# Patient Record
Sex: Male | Born: 1960
Health system: Southern US, Community
[De-identification: ages and names within clinical notes are randomized; demographics above are authoritative.]

---

## 2018-06-12 ENCOUNTER — Inpatient Hospital Stay
Admission: RE | Admit: 2018-06-12 | Discharge: 2018-06-22 | Disposition: A | Discharge status: Other Acute Inpatient Hospital | Payer: Self-pay | Source: Other Acute Inpatient Hospital | Attending: Internal Medicine | Admitting: Internal Medicine

## 2018-06-12 DIAGNOSIS — Z452 Encounter for adjustment and management of vascular access device: Secondary | ICD-10-CM

## 2018-06-12 DIAGNOSIS — R509 Fever, unspecified: Secondary | ICD-10-CM

## 2018-06-12 DIAGNOSIS — J90 Pleural effusion, not elsewhere classified: Secondary | ICD-10-CM

## 2018-06-12 DIAGNOSIS — K289 Gastrojejunal ulcer, unspecified as acute or chronic, without hemorrhage or perforation: Secondary | ICD-10-CM

## 2018-06-13 LAB — BASIC METABOLIC PANEL
Anion gap: 8 (ref 5–15)
BUN: 27 mg/dL — ABNORMAL HIGH (ref 6–20)
CHLORIDE: 119 mmol/L — AB (ref 98–111)
CO2: 27 mmol/L (ref 22–32)
Calcium: 7.5 mg/dL — ABNORMAL LOW (ref 8.9–10.3)
Creatinine, Ser: 1.06 mg/dL (ref 0.61–1.24)
GFR calc Af Amer: >60 mL/min (ref >60)
GFR calc non Af Amer: >60 mL/min (ref >60)
Glucose, Bld: 126 mg/dL — ABNORMAL HIGH (ref 70–99)
Potassium: 3.3 mmol/L — ABNORMAL LOW (ref 3.5–5.1)
SODIUM: 154 mmol/L — AB (ref 135–145)

## 2018-06-13 LAB — CBC
HCT: 25.3 % — ABNORMAL LOW (ref 39.0–52.0)
HEMOGLOBIN: 7.8 g/dL — AB (ref 13.0–17.0)
MCH: 28.8 pg (ref 26.0–34.0)
MCHC: 30.8 g/dL (ref 30.0–36.0)
MCV: 93.4 fL (ref 80.0–100.0)
NRBC: 0.1 % (ref 0.0–0.2)
Platelets: 399 10*3/uL (ref 150–400)
RBC: 2.71 MIL/uL — ABNORMAL LOW (ref 4.22–5.81)
RDW: 16.2 % — ABNORMAL HIGH (ref 11.5–15.5)
WBC: 31.1 10*3/uL — ABNORMAL HIGH (ref 4.0–10.5)

## 2018-06-14 LAB — CBC
HCT: 26.8 % — ABNORMAL LOW (ref 39.0–52.0)
HEMOGLOBIN: 7.7 g/dL — AB (ref 13.0–17.0)
MCH: 27.5 pg (ref 26.0–34.0)
MCHC: 28.7 g/dL — ABNORMAL LOW (ref 30.0–36.0)
MCV: 95.7 fL (ref 80.0–100.0)
Platelets: 433 10*3/uL — ABNORMAL HIGH (ref 150–400)
RBC: 2.8 MIL/uL — ABNORMAL LOW (ref 4.22–5.81)
RDW: 16.8 % — ABNORMAL HIGH (ref 11.5–15.5)
WBC: 25.2 10*3/uL — ABNORMAL HIGH (ref 4.0–10.5)
nRBC: 0 % (ref 0.0–0.2)

## 2018-06-14 LAB — COMPREHENSIVE METABOLIC PANEL
ALT: 25 U/L (ref 0–44)
AST: 19 U/L (ref 15–41)
Albumin: 1.6 g/dL — ABNORMAL LOW (ref 3.5–5.0)
Alkaline Phosphatase: 97 U/L (ref 38–126)
Anion gap: 8 (ref 5–15)
BUN: 27 mg/dL — ABNORMAL HIGH (ref 6–20)
CALCIUM: 7.8 mg/dL — AB (ref 8.9–10.3)
CO2: 28 mmol/L (ref 22–32)
Chloride: 122 mmol/L — ABNORMAL HIGH (ref 98–111)
Creatinine, Ser: 0.93 mg/dL (ref 0.61–1.24)
GFR calc Af Amer: >60 mL/min (ref >60)
GFR calc non Af Amer: >60 mL/min (ref >60)
Glucose, Bld: 116 mg/dL — ABNORMAL HIGH (ref 70–99)
Potassium: 2.9 mmol/L — ABNORMAL LOW (ref 3.5–5.1)
Sodium: 158 mmol/L — ABNORMAL HIGH (ref 135–145)
Total Bilirubin: 0.4 mg/dL (ref 0.3–1.2)
Total Protein: 5.4 g/dL — ABNORMAL LOW (ref 6.5–8.1)

## 2018-06-14 LAB — MAGNESIUM: Magnesium: 1.8 mg/dL (ref 1.7–2.4)

## 2018-06-14 LAB — TRIGLYCERIDES: Triglycerides: 52 mg/dL (ref <150)

## 2018-06-14 LAB — PHOSPHORUS: Phosphorus: 4.1 mg/dL (ref 2.5–4.6)

## 2018-06-15 LAB — CBC
HCT: 25.6 % — ABNORMAL LOW (ref 39.0–52.0)
Hemoglobin: 7.8 g/dL — ABNORMAL LOW (ref 13.0–17.0)
MCH: 29 pg (ref 26.0–34.0)
MCHC: 30.5 g/dL (ref 30.0–36.0)
MCV: 95.2 fL (ref 80.0–100.0)
Platelets: 401 10*3/uL — ABNORMAL HIGH (ref 150–400)
RBC: 2.69 MIL/uL — ABNORMAL LOW (ref 4.22–5.81)
RDW: 16.2 % — ABNORMAL HIGH (ref 11.5–15.5)
WBC: 24.3 10*3/uL — ABNORMAL HIGH (ref 4.0–10.5)
nRBC: 0 % (ref 0.0–0.2)

## 2018-06-15 LAB — BASIC METABOLIC PANEL
Anion gap: 9 (ref 5–15)
BUN: 20 mg/dL (ref 6–20)
CALCIUM: 7.5 mg/dL — AB (ref 8.9–10.3)
CO2: 24 mmol/L (ref 22–32)
Chloride: 115 mmol/L — ABNORMAL HIGH (ref 98–111)
Creatinine, Ser: 0.72 mg/dL (ref 0.61–1.24)
GFR calc Af Amer: >60 mL/min (ref >60)
GFR calc non Af Amer: >60 mL/min (ref >60)
Glucose, Bld: 117 mg/dL — ABNORMAL HIGH (ref 70–99)
Potassium: 3.3 mmol/L — ABNORMAL LOW (ref 3.5–5.1)
SODIUM: 148 mmol/L — AB (ref 135–145)

## 2018-06-15 LAB — MAGNESIUM: Magnesium: 1.4 mg/dL — ABNORMAL LOW (ref 1.7–2.4)

## 2018-06-16 LAB — BASIC METABOLIC PANEL
ANION GAP: 9 (ref 5–15)
BUN: 21 mg/dL — ABNORMAL HIGH (ref 6–20)
CO2: 22 mmol/L (ref 22–32)
Calcium: 7.1 mg/dL — ABNORMAL LOW (ref 8.9–10.3)
Chloride: 109 mmol/L (ref 98–111)
Creatinine, Ser: 0.79 mg/dL (ref 0.61–1.24)
GFR calc non Af Amer: >60 mL/min (ref >60)
Glucose, Bld: 123 mg/dL — ABNORMAL HIGH (ref 70–99)
Potassium: 2.8 mmol/L — ABNORMAL LOW (ref 3.5–5.1)
Sodium: 140 mmol/L (ref 135–145)

## 2018-06-16 LAB — MAGNESIUM: MAGNESIUM: 1.7 mg/dL (ref 1.7–2.4)

## 2018-06-16 LAB — PHOSPHORUS: PHOSPHORUS: 3.4 mg/dL (ref 2.5–4.6)

## 2018-06-17 LAB — URINALYSIS, ROUTINE W REFLEX MICROSCOPIC
Bilirubin Urine: NEGATIVE
Glucose, UA: 150 mg/dL — AB
Ketones, ur: NEGATIVE mg/dL
Nitrite: NEGATIVE
Protein, ur: NEGATIVE mg/dL
RBC / HPF: >50 RBC/hpf — ABNORMAL HIGH (ref 0–5)
Specific Gravity, Urine: 1.009 (ref 1.005–1.030)
pH: 7 (ref 5.0–8.0)

## 2018-06-17 LAB — CBC
HCT: 21.9 % — ABNORMAL LOW (ref 39.0–52.0)
Hemoglobin: 6.6 g/dL — CL (ref 13.0–17.0)
MCH: 28.3 pg (ref 26.0–34.0)
MCHC: 30.1 g/dL (ref 30.0–36.0)
MCV: 94 fL (ref 80.0–100.0)
Platelets: 462 10*3/uL — ABNORMAL HIGH (ref 150–400)
RBC: 2.33 MIL/uL — ABNORMAL LOW (ref 4.22–5.81)
RDW: 15.7 % — ABNORMAL HIGH (ref 11.5–15.5)
WBC: 13.3 10*3/uL — ABNORMAL HIGH (ref 4.0–10.5)
nRBC: 0 % (ref 0.0–0.2)

## 2018-06-17 LAB — BASIC METABOLIC PANEL
Anion gap: 7 (ref 5–15)
BUN: 20 mg/dL (ref 6–20)
CO2: 20 mmol/L — ABNORMAL LOW (ref 22–32)
Calcium: 7.2 mg/dL — ABNORMAL LOW (ref 8.9–10.3)
Chloride: 111 mmol/L (ref 98–111)
Creatinine, Ser: 0.88 mg/dL (ref 0.61–1.24)
GFR calc Af Amer: >60 mL/min (ref >60)
Glucose, Bld: 117 mg/dL — ABNORMAL HIGH (ref 70–99)
Potassium: 3.5 mmol/L (ref 3.5–5.1)
Sodium: 138 mmol/L (ref 135–145)

## 2018-06-17 LAB — ABO/RH: ABO/RH(D): O NEG

## 2018-06-17 LAB — MAGNESIUM: Magnesium: 1.7 mg/dL (ref 1.7–2.4)

## 2018-06-17 LAB — PREPARE RBC (CROSSMATCH)

## 2018-06-18 LAB — CBC
HCT: 25.4 % — ABNORMAL LOW (ref 39.0–52.0)
Hemoglobin: 7.9 g/dL — ABNORMAL LOW (ref 13.0–17.0)
MCH: 28.8 pg (ref 26.0–34.0)
MCHC: 31.1 g/dL (ref 30.0–36.0)
MCV: 92.7 fL (ref 80.0–100.0)
PLATELETS: 441 10*3/uL — AB (ref 150–400)
RBC: 2.74 MIL/uL — ABNORMAL LOW (ref 4.22–5.81)
RDW: 15.6 % — ABNORMAL HIGH (ref 11.5–15.5)
WBC: 13.4 10*3/uL — AB (ref 4.0–10.5)
nRBC: 0 % (ref 0.0–0.2)

## 2018-06-18 LAB — BPAM RBC
Blood Product Expiration Date: 202001032359
ISSUE DATE / TIME: 201912161107
Unit Type and Rh: 9500

## 2018-06-18 LAB — TYPE AND SCREEN
ABO/RH(D): O NEG
Antibody Screen: NEGATIVE
Unit division: 0

## 2018-06-18 LAB — MAGNESIUM: MAGNESIUM: 1.6 mg/dL — AB (ref 1.7–2.4)

## 2018-06-19 ENCOUNTER — Other Ambulatory Visit (HOSPITAL_COMMUNITY): Payer: Self-pay

## 2018-06-19 LAB — CBC
HCT: 25.2 % — ABNORMAL LOW (ref 39.0–52.0)
Hemoglobin: 8.2 g/dL — ABNORMAL LOW (ref 13.0–17.0)
MCH: 29.8 pg (ref 26.0–34.0)
MCHC: 32.5 g/dL (ref 30.0–36.0)
MCV: 91.6 fL (ref 80.0–100.0)
Platelets: 447 10*3/uL — ABNORMAL HIGH (ref 150–400)
RBC: 2.75 MIL/uL — ABNORMAL LOW (ref 4.22–5.81)
RDW: 15.5 % (ref 11.5–15.5)
WBC: 6.7 10*3/uL (ref 4.0–10.5)
nRBC: 0 % (ref 0.0–0.2)

## 2018-06-19 LAB — COMPREHENSIVE METABOLIC PANEL
ALT: 28 U/L (ref 0–44)
AST: 17 U/L (ref 15–41)
Albumin: 1.5 g/dL — ABNORMAL LOW (ref 3.5–5.0)
Alkaline Phosphatase: 133 U/L — ABNORMAL HIGH (ref 38–126)
Anion gap: 8 (ref 5–15)
BILIRUBIN TOTAL: 0.3 mg/dL (ref 0.3–1.2)
BUN: 14 mg/dL (ref 6–20)
CO2: 19 mmol/L — ABNORMAL LOW (ref 22–32)
Calcium: 6.9 mg/dL — ABNORMAL LOW (ref 8.9–10.3)
Chloride: 109 mmol/L (ref 98–111)
Creatinine, Ser: 0.86 mg/dL (ref 0.61–1.24)
GFR calc Af Amer: >60 mL/min (ref >60)
GFR calc non Af Amer: >60 mL/min (ref >60)
Glucose, Bld: 111 mg/dL — ABNORMAL HIGH (ref 70–99)
POTASSIUM: 2.7 mmol/L — AB (ref 3.5–5.1)
Sodium: 136 mmol/L (ref 135–145)
TOTAL PROTEIN: 5.7 g/dL — AB (ref 6.5–8.1)

## 2018-06-19 LAB — POTASSIUM: Potassium: 2.5 mmol/L — CL (ref 3.5–5.1)

## 2018-06-19 LAB — URINE CULTURE: Culture: NO GROWTH

## 2018-06-19 LAB — MAGNESIUM: Magnesium: 1.6 mg/dL — ABNORMAL LOW (ref 1.7–2.4)

## 2018-06-19 LAB — URINALYSIS, ROUTINE W REFLEX MICROSCOPIC
Bilirubin Urine: NEGATIVE
Glucose, UA: 50 mg/dL — AB
Hgb urine dipstick: NEGATIVE
Ketones, ur: NEGATIVE mg/dL
Leukocytes, UA: NEGATIVE
Nitrite: NEGATIVE
Protein, ur: NEGATIVE mg/dL
Specific Gravity, Urine: 1.009 (ref 1.005–1.030)
pH: 7 (ref 5.0–8.0)

## 2018-06-20 LAB — CBC
HCT: 22 % — ABNORMAL LOW (ref 39.0–52.0)
HCT: 16.1 % — ABNORMAL LOW (ref 39.0–52.0)
Hemoglobin: 7.1 g/dL — ABNORMAL LOW (ref 13.0–17.0)
Hemoglobin: 5 g/dL — CL (ref 13.0–17.0)
MCH: 29.7 pg (ref 26.0–34.0)
MCH: 29.1 pg (ref 26.0–34.0)
MCHC: 32.3 g/dL (ref 30.0–36.0)
MCHC: 31.1 g/dL (ref 30.0–36.0)
MCV: 92.1 fL (ref 80.0–100.0)
MCV: 93.6 fL (ref 80.0–100.0)
Platelets: 356 10*3/uL (ref 150–400)
Platelets: 302 10*3/uL (ref 150–400)
RBC: 2.39 MIL/uL — ABNORMAL LOW (ref 4.22–5.81)
RBC: 1.72 MIL/uL — ABNORMAL LOW (ref 4.22–5.81)
RDW: 15.5 % (ref 11.5–15.5)
RDW: 16 % — ABNORMAL HIGH (ref 11.5–15.5)
WBC: 4.1 10*3/uL (ref 4.0–10.5)
WBC: 4.2 10*3/uL (ref 4.0–10.5)
nRBC: 0 % (ref 0.0–0.2)
nRBC: 0 % (ref 0.0–0.2)

## 2018-06-20 LAB — BASIC METABOLIC PANEL
Anion gap: 7 (ref 5–15)
BUN: 12 mg/dL (ref 6–20)
CO2: 20 mmol/L — ABNORMAL LOW (ref 22–32)
Calcium: 6.7 mg/dL — ABNORMAL LOW (ref 8.9–10.3)
Chloride: 108 mmol/L (ref 98–111)
Creatinine, Ser: 0.83 mg/dL (ref 0.61–1.24)
GFR calc Af Amer: >60 mL/min (ref >60)
Glucose, Bld: 121 mg/dL — ABNORMAL HIGH (ref 70–99)
Potassium: 2.9 mmol/L — ABNORMAL LOW (ref 3.5–5.1)
Sodium: 135 mmol/L (ref 135–145)

## 2018-06-20 LAB — URINE CULTURE: Culture: NO GROWTH

## 2018-06-20 LAB — LACTIC ACID, PLASMA
Lactic Acid, Venous: 0.9 mmol/L (ref 0.5–1.9)
Lactic Acid, Venous: 0.8 mmol/L (ref 0.5–1.9)

## 2018-06-20 LAB — MAGNESIUM: MAGNESIUM: 1.6 mg/dL — AB (ref 1.7–2.4)

## 2018-06-20 LAB — VANCOMYCIN, TROUGH: VANCOMYCIN TR: 14 ug/mL — AB (ref 15–20)

## 2018-06-21 ENCOUNTER — Other Ambulatory Visit (HOSPITAL_COMMUNITY): Payer: Self-pay

## 2018-06-21 DIAGNOSIS — I9589 Other hypotension: Secondary | ICD-10-CM

## 2018-06-21 DIAGNOSIS — D62 Acute posthemorrhagic anemia: Secondary | ICD-10-CM

## 2018-06-21 DIAGNOSIS — Z8711 Personal history of peptic ulcer disease: Secondary | ICD-10-CM

## 2018-06-21 DIAGNOSIS — Z9884 Bariatric surgery status: Secondary | ICD-10-CM

## 2018-06-21 LAB — CBC
HCT: 14.7 % — ABNORMAL LOW (ref 39.0–52.0)
HCT: 22.8 % — ABNORMAL LOW (ref 39.0–52.0)
Hemoglobin: 4.8 g/dL — CL (ref 13.0–17.0)
Hemoglobin: 7.5 g/dL — ABNORMAL LOW (ref 13.0–17.0)
MCH: 30.6 pg (ref 26.0–34.0)
MCH: 29.3 pg (ref 26.0–34.0)
MCHC: 32.7 g/dL (ref 30.0–36.0)
MCHC: 32.9 g/dL (ref 30.0–36.0)
MCV: 93.6 fL (ref 80.0–100.0)
MCV: 89.1 fL (ref 80.0–100.0)
Platelets: 312 10*3/uL (ref 150–400)
Platelets: 274 10*3/uL (ref 150–400)
RBC: 1.57 MIL/uL — ABNORMAL LOW (ref 4.22–5.81)
RBC: 2.56 MIL/uL — ABNORMAL LOW (ref 4.22–5.81)
RDW: 16.1 % — ABNORMAL HIGH (ref 11.5–15.5)
RDW: 14.6 % (ref 11.5–15.5)
WBC: 4 10*3/uL (ref 4.0–10.5)
WBC: 4.2 10*3/uL (ref 4.0–10.5)
nRBC: 0 % (ref 0.0–0.2)
nRBC: 0 % (ref 0.0–0.2)

## 2018-06-21 LAB — MAGNESIUM
Magnesium: 1.5 mg/dL — ABNORMAL LOW (ref 1.7–2.4)
Magnesium: 1.3 mg/dL — ABNORMAL LOW (ref 1.7–2.4)

## 2018-06-21 LAB — HEMOGLOBIN AND HEMATOCRIT, BLOOD
HCT: 19.6 % — ABNORMAL LOW (ref 39.0–52.0)
Hemoglobin: 6.3 g/dL — CL (ref 13.0–17.0)

## 2018-06-21 LAB — COMPREHENSIVE METABOLIC PANEL
ALT: 239 U/L — ABNORMAL HIGH (ref 0–44)
AST: 226 U/L — ABNORMAL HIGH (ref 15–41)
Albumin: 1.5 g/dL — ABNORMAL LOW (ref 3.5–5.0)
Alkaline Phosphatase: 95 U/L (ref 38–126)
Anion gap: 7 (ref 5–15)
BUN: 14 mg/dL (ref 6–20)
CHLORIDE: 115 mmol/L — AB (ref 98–111)
CO2: 17 mmol/L — ABNORMAL LOW (ref 22–32)
Calcium: 6.5 mg/dL — ABNORMAL LOW (ref 8.9–10.3)
Creatinine, Ser: 0.82 mg/dL (ref 0.61–1.24)
GFR calc Af Amer: >60 mL/min (ref >60)
GFR calc non Af Amer: >60 mL/min (ref >60)
Glucose, Bld: 128 mg/dL — ABNORMAL HIGH (ref 70–99)
POTASSIUM: 3 mmol/L — AB (ref 3.5–5.1)
Sodium: 139 mmol/L (ref 135–145)
Total Bilirubin: 0.7 mg/dL (ref 0.3–1.2)
Total Protein: 4.8 g/dL — ABNORMAL LOW (ref 6.5–8.1)

## 2018-06-21 LAB — POTASSIUM: Potassium: 3.8 mmol/L (ref 3.5–5.1)

## 2018-06-21 LAB — TRIGLYCERIDES: Triglycerides: 88 mg/dL (ref <150)

## 2018-06-21 LAB — PREPARE RBC (CROSSMATCH)

## 2018-06-21 MED ORDER — IOHEXOL 300 MG/ML  SOLN
100.0000 mL | Freq: Once | INTRAMUSCULAR | Status: AC | PRN
  Administered 2018-06-21: 100 mL via INTRAVENOUS

## 2018-06-21 NOTE — Consult Note (Addendum)
                                                                           [facility] Consult: 12:37 PM 06/21/2018  LOS: 0 days    Referring Provider: Dr Hijazi  Primary Care Physician:  No primary care provider on file. Primary Gastroenterologist: Unassigned patient at [facility]   Reason for Consultation: GI bleed.   HPI: Maurice Alvarado is a 57 y.o. male.  Resident of [place].  PMH chronic back pain on chronic narcotics, lidocaine patch, ibuprofen.  Smokes 1 pack/day.  Patient suffered perforated gastric ulcer requiring antrectomy and retrocolic Roux-en-Y reconstruction 05/13/2018 in [place], [place].  Developed postoperative leak and was transferred to [facility] 11/21.  Intra-abdominal fluid collection treated with perc drain. He improved and was discharged 05/27/18 on Augmentin.    On 06/11/2018 he was transferred from [facility] to [facility] after developing altered mental status, hypotension.  WBCs 53.5, AKI.   T bili 1.1.  Alkaline phosphatase 150, AST/ALT 35/33.  Electrolytes deranged  Noncontrast CT showed known intra-abdominal fluid collections with drain in place.  Decreased, trace pneumoperitoneum anterior to the liver.  Left drainage catheter in place with distal and just anterior to the left hepatic lobe.  The left anterior abdominal fluid collection persists though considerably decreased in size.  Hyperdense contrast within the collection extending to supraumbilical ventral midline incision plane.  Diffuse mesenteric edema.  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  Upper normal to slightly dilated, distal end of the afferent limb within the right hemiabdomen opacified with retrograde contrast.  Proximal aspect of the afferent limb unopacified.  Small bowel otherwise normal  caliber with mild, diffuse wall thickening.  Diffuse thickening of the stomach as well as the majority of the colon.  Enteric contrast opacifies the majority of small bowel loops as well as the colon through the rectum small amount of free pelvic fluid.  Diffuse body wall edema.  Gallbladder wall thickened likely secondary to general edematous state there was a 3 x 1.2 cm fluid collection in the right hepatic lobe which was decreased compared with size of 4.5 x 2.5 on 05/23/2018.  Required Levophed for pressure support. Abscess drain was upsized.  He was treated with broad-spectrum antibiotics.    There was concern for upper GI bleed given blood-tinged NG tube output and Hgb of 6.4.  He received blood transfusion. Hgb 7.4 on 06/11/18.  Nutritional support provided with TPN which continues to this day. Perc drain contents looks dark, but more bilious than bloody.  He continues on TPN, he is n.p.o.  Ongoing BID IV Protonix, broad-spectrum antibiotics. While he has had ongoing anemia during the admission, Hgb 7.8 at arrival.  6.6 on 12/16, back up to 8.2 on 12/18.  Drifted to 5 on 12/19, 4.8 this morning, 7.5 after 2 U PRBCs   Yesterday he started having tarry stools, passed several of these.  Became hypotensive with reading as low as 70/34.  Blood pressures responded to aggressive IV fluids, received at least 2 L of normal saline yesterday.  Today the tarry stools turned to more of a red bloody BM.  This is   not associated with abdominal pain or nausea.   Persistent fever to 101.1 despite Tylenol PR. 06/21/2018 CT abdomen pelvis with contrast shows bil pleural effusions and atelectasis.  Postop changes consistent with gastric bypass without evidence of inflammation or obstruction.  Small amount of free fluid and free air in the abdomen, likely postoperative.  Pigtail drainage catheter present in left upper quadrant fluid collection.  Small amount of gas within the collection.  Urinary bladder thickened and  contains 3 mm stone.  BUN is not elevated.  Lactic acid not elevated x2 yesterday  Allergies:   None known.  Scheduled Meds: TNA IV thiamine daily Humalog sliding scale insulin Fentanyl patch every 72 hours Zosyn Vancomycin IV Protonix 40 mg IV twice daily   Social History  Patient is divorced.  Does not drink alcohol.  Smokes about a pack per day.  Employed as an automobile mechanic. Did contact information is a friend Mark Barrett his mobile number [phone]   REVIEW OF SYSTEMS: Constitutional: Patient denies feeling weak or dizzy. ENT:  No nose bleeds Pulm: Denies shortness of breath and cough. CV:  No palpitations, no LE edema.  No chest pain GU: No hematuria or frequency. GI: Denies nausea, vomiting, abdominal pain. Heme: Unaware of any issues with unusual or excessive bleeding. Transfusions: per HPI Neuro:  No headaches, no peripheral tingling or numbness Derm:  No itching, no rash or sores.  Endocrine:  No sweats or chills.  No polyuria or dysuria Immunization:  Not known Travel:  None beyond local counties in last few months.    PHYSICAL EXAM: Vital signs in last 24 hours: There were no vitals filed for this visit. Wt Readings from Last 3 Encounters:  No data found for Wt  Temp 100.9.  Blood pressure 103/53.  Heart rate 90.  Pulse ox 96%.  Weight 116.7 # (baseline prior to perforated ulcer 145#)  General: Cachectic, pale, chronically ill looking.  Sitting in bed watching TV, comfortable. Head: No facial asymmetry or swelling.  No signs of head trauma. Eyes: Slight conjunctival pallor.  No scleral icterus.  EOMI. Ears: Not hard of hearing Nose: No discharge or congestion Mouth: Edentulous.  Full set of dentures in place, not removed for exam.  Tongue midline.  Oral mucosa moist, pink, clear. Neck: No JVD, no masses, no thyromegaly. Lungs: No labored breathing, no cough.  No adventitious sounds. Heart: RRR.  No MRG.  S1, S2 present Abdomen: Soft.  Not  tender, not distended.  Active bowel sounds.  Wound VAC with dark, bilious, not bloody, nonpurulent liquid.  No leaking from the wound..   Rectal: Did not perform. Musc/Skeltl: No joint redness, swelling. Extremities: No CCE.  Limbs generally thin with muscular wasting. Neurologic: Alert.  Follows commands.  Full limb strength.  No tremors.  Oriented to self.  Answered [facility] as to where he was.  Gave year as 2020. Skin: Pale Tattoos: Multiple amateur/somewhat crude tattoos on the arms Nodes: No cervical adenopathy Psych: Calm, pleasant, cooperative.  Intake/Output from previous day: No intake/output data recorded. Intake/Output this shift: No intake/output data recorded.  LAB RESULTS: Recent Labs    06/20/18 2209 06/21/18 0052 06/21/18 1031  WBC 4.2 4.0 4.2  HGB 5.0* 4.8* 7.5*  HCT 16.1* 14.7* 22.8*  PLT 302 312 274   BMET Lab Results  Component Value Date   NA 139 06/21/2018   NA 135 06/20/2018   NA 136 06/19/2018   K 3.0 (L) 06/21/2018   K 3.8 06/21/2018     K 2.9 (L) 06/20/2018   CL 115 (H) 06/21/2018   CL 108 06/20/2018   CL 109 06/19/2018   CO2 17 (L) 06/21/2018   CO2 20 (L) 06/20/2018   CO2 19 (L) 06/19/2018   GLUCOSE 128 (H) 06/21/2018   GLUCOSE 121 (H) 06/20/2018   GLUCOSE 111 (H) 06/19/2018   BUN 14 06/21/2018   BUN 12 06/20/2018   BUN 14 06/19/2018   CREATININE 0.82 06/21/2018   CREATININE 0.83 06/20/2018   CREATININE 0.86 06/19/2018   CALCIUM 6.5 (L) 06/21/2018   CALCIUM 6.7 (L) 06/20/2018   CALCIUM 6.9 (L) 06/19/2018   LFT Recent Labs    06/19/18 0824 06/21/18 1031  PROT 5.7* 4.8*  ALBUMIN 1.5* 1.5*  AST 17 226*  ALT 28 239*  ALKPHOS 133* 95  BILITOT 0.3 0.7   PT/INR No results found for: INR, PROTIME Hepatitis Panel No results for input(s): HEPBSAG, HCVAB, HEPAIGM, HEPBIGM in the last 72 hours. C-Diff No components found for: CDIFF Lipase  No results found for: LIPASE  Drugs of Abuse  No results found for: LABOPIA,  COCAINSCRNUR, LABBENZ, AMPHETMU, THCU, LABBARB   RADIOLOGY STUDIES: Ct Abdomen Pelvis W Contrast  Result Date: 06/21/2018 CLINICAL DATA:  Generalized abdominal pain. Recent abdominal surgery. Nausea and rectal bleeding. Fever of unknown origin. EXAM: CT ABDOMEN AND PELVIS WITH CONTRAST TECHNIQUE: Multidetector CT imaging of the abdomen and pelvis was performed using the standard protocol following bolus administration of intravenous contrast. CONTRAST:  100mL OMNIPAQUE IOHEXOL 300 MG/ML  SOLN COMPARISON:  None. FINDINGS: Lower chest: Moderate bilateral pleural effusions with basilar atelectasis. Hepatobiliary: No focal liver abnormality is seen. No gallstones, gallbladder wall thickening, or biliary dilatation. Pancreas: Unremarkable. No pancreatic ductal dilatation or surrounding inflammatory changes. Spleen: Normal in size without focal abnormality. Adrenals/Urinary Tract: No adrenal gland nodules. Punctate sized intrarenal stones bilaterally. Nephrograms are symmetrical. Mild bilateral hydronephrosis. Mild bladder wall thickening may indicate cystitis. Stone in the dependent left bladder measuring 3 mm diameter. This may represent a recently passed stone. Nephrograms are symmetrical. Stomach/Bowel: Stomach and small bowel are mostly decompressed. No abnormal colonic distention or wall thickening. Contrast material in the rectosigmoid colon indicating no evidence of obstruction. No discrete wall thickening or inflammatory infiltration identified. Postoperative changes consistent with gastric bypass. Vascular/Lymphatic: Aortic atherosclerosis. No enlarged abdominal or pelvic lymph nodes. Reproductive: Prostate is unremarkable. Other: Small amount of free fluid and free air in the abdomen, likely postoperative. There is left upper quadrant fluid collection anteriorly with fluid or thickening in the anterior abdominal wall muscles. A pigtail drainage catheter is present within the collection. Small amount of  gas also in the collection. Right lower quadrant anterior subcutaneous gas likely results from injection site. Musculoskeletal: No acute or significant osseous findings. IMPRESSION: 1. Moderate bilateral pleural effusions with basilar atelectasis. 2. Postoperative changes consistent with gastric bypass. No evidence of bowel obstruction or inflammation. 3. Small amount of free fluid and free air in the abdomen, likely postoperative. Left upper quadrant fluid collection anteriorly with fluid or thickening in the anterior abdominal wall muscles. A pigtail drainage catheter is present within the collection. Small amount of gas also in the collection. 4. Bladder wall thickening may indicate cystitis. 3 mm stone in the dependent left bladder may represent a recently passed stone. Aortic Atherosclerosis (ICD10-I70.0). Electronically Signed   By: William  Stevens M.D.   On: 06/21/2018 02:09   Dg Chest Port 1 View  Result Date: 06/19/2018 CLINICAL DATA:  Catheter placement. EXAM: PORTABLE CHEST 1 VIEW   COMPARISON:  Chest radiograph June 19, 2018 at 0838 hours. FINDINGS: Redirected RIGHT tunneled catheter distal tip now projects at cavoatrial junction. No pneumothorax. Stable RIGHT lung base bandlike density. Small pleural effusions. Mild bronchitic changes. Cardiomediastinal silhouette is normal. Soft tissue planes and included osseous structures are nonacute. IMPRESSION: 1. Redirected RIGHT tunneled catheter distal tip projects at cavoatrial junction. 2. Stable RIGHT lower lobe suspected pneumonia. Bronchitic changes and small pleural effusions. Followup PA and lateral chest X-ray is recommended in 3-4 weeks following trial of antibiotic therapy to ensure resolution and exclude underlying malignancy. Electronically Signed   By: Courtnay  Bloomer M.D.   On: 06/19/2018 14:03     IMPRESSION:   *    GI bleed patient with complicated overrated gastric ulcer requiring surgery and drain placement for postop wound  leak/abscess..  Yesterday developed melenic stools, the last few today have been more frank blood.  *     Hypotension, improved with boluses of IV fluids yesterday  *    Acute on chronic anemia.  Currently blood loss contributing but suspect baseline of anemia of chronic disease.  Transfuse blood during hospitalization at [facility].  Transfuse 2 units PRBCs this morning.    *    Perforated ulcer early November 2019.  11/11 antrectomy and retrocolic Roux-en-Y.  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  *   Hypokalemia.  Hypomagnesemia.  Protein calorie malnutrition, remains on TPN.  *    Elevated transaminases.  CT scan of abdomen and pelvis today shows catheter in the left upper quadrant fluid collection.  Suspect transaminases elevated to recent hypotension, hepatic hypoperfusion/shock as well as ongoing TPN, other than hypoalbuminemia and mildly elevated alk phos 133, LFTs were normal on 12/18.  *    Fevers, tachycardia without leukocytosis.   Lactic acid not elevated. CXR 3 d ago with RLL PNA.   Antibiotics: Vancomycin, Zosyn.   PLAN:     *    Case D/W Dr. Mansouraty.  Plan EGD tomorrow, time TBD.  Staff at [facility] aware.  Continue IV Protonix for now.    Sarah Gribbin  06/21/2018, 12:37 PM Phone [phone]     

## 2018-06-22 ENCOUNTER — Encounter
Admission: RE | Disposition: A | Discharge status: Other Acute Inpatient Hospital | Payer: Self-pay | Source: Other Acute Inpatient Hospital | Attending: Internal Medicine

## 2018-06-22 ENCOUNTER — Encounter (HOSPITAL_COMMUNITY): Payer: Self-pay | Admitting: Certified Registered Nurse Anesthetist

## 2018-06-22 ENCOUNTER — Encounter (HOSPITAL_COMMUNITY): Payer: Self-pay | Admitting: Gastroenterology

## 2018-06-22 ENCOUNTER — Other Ambulatory Visit: Payer: Self-pay

## 2018-06-22 DIAGNOSIS — K289 Gastrojejunal ulcer, unspecified as acute or chronic, without hemorrhage or perforation: Secondary | ICD-10-CM

## 2018-06-22 HISTORY — PX: ESOPHAGOGASTRODUODENOSCOPY (EGD) WITH PROPOFOL: SHX5813

## 2018-06-22 LAB — CBC
HCT: 29.5 % — ABNORMAL LOW (ref 39.0–52.0)
Hemoglobin: 9.9 g/dL — ABNORMAL LOW (ref 13.0–17.0)
MCH: 30.1 pg (ref 26.0–34.0)
MCHC: 33.6 g/dL (ref 30.0–36.0)
MCV: 89.7 fL (ref 80.0–100.0)
PLATELETS: 233 10*3/uL (ref 150–400)
RBC: 3.29 MIL/uL — ABNORMAL LOW (ref 4.22–5.81)
RDW: 15.3 % (ref 11.5–15.5)
WBC: 4 10*3/uL (ref 4.0–10.5)
nRBC: 0 % (ref 0.0–0.2)

## 2018-06-22 LAB — BASIC METABOLIC PANEL
Anion gap: 7 (ref 5–15)
BUN: 13 mg/dL (ref 6–20)
CO2: 19 mmol/L — ABNORMAL LOW (ref 22–32)
Calcium: 6.6 mg/dL — ABNORMAL LOW (ref 8.9–10.3)
Chloride: 112 mmol/L — ABNORMAL HIGH (ref 98–111)
Creatinine, Ser: 0.91 mg/dL (ref 0.61–1.24)
GFR calc Af Amer: >60 mL/min (ref >60)
GFR calc non Af Amer: >60 mL/min (ref >60)
Glucose, Bld: 123 mg/dL — ABNORMAL HIGH (ref 70–99)
Potassium: 3.1 mmol/L — ABNORMAL LOW (ref 3.5–5.1)
Sodium: 138 mmol/L (ref 135–145)

## 2018-06-22 LAB — HEMOGLOBIN AND HEMATOCRIT, BLOOD
HCT: 20 % — ABNORMAL LOW (ref 39.0–52.0)
HEMOGLOBIN: 6.5 g/dL — AB (ref 13.0–17.0)

## 2018-06-22 LAB — FIBRINOGEN: Fibrinogen: 381 mg/dL (ref 210–475)

## 2018-06-22 LAB — MAGNESIUM: Magnesium: 1.5 mg/dL — ABNORMAL LOW (ref 1.7–2.4)

## 2018-06-22 LAB — PREPARE RBC (CROSSMATCH)

## 2018-06-22 LAB — APTT: aPTT: 25 seconds (ref 24–36)

## 2018-06-22 LAB — PROTIME-INR
INR: 1.23
PROTHROMBIN TIME: 15.3 s — AB (ref 11.4–15.2)

## 2018-06-22 SURGERY — ESOPHAGOGASTRODUODENOSCOPY (EGD) WITH PROPOFOL
Anesthesia: Monitor Anesthesia Care

## 2018-06-22 MED ORDER — PROPOFOL 500 MG/50ML IV EMUL
INTRAVENOUS | Status: DC | PRN
  Administered 2018-06-22: 100 ug/kg/min via INTRAVENOUS

## 2018-06-22 MED ORDER — PROPOFOL 10 MG/ML IV BOLUS
INTRAVENOUS | Status: DC | PRN
  Administered 2018-06-22 (×2): 10 mg via INTRAVENOUS

## 2018-06-22 MED ORDER — ONDANSETRON HCL 4 MG/2ML IJ SOLN
INTRAMUSCULAR | Status: DC | PRN
  Administered 2018-06-22: 4 mg via INTRAVENOUS

## 2018-06-22 MED ORDER — SODIUM CHLORIDE 0.9 % IV SOLN
INTRAVENOUS | Status: DC | PRN
  Administered 2018-06-22: 08:00:00 via INTRAVENOUS

## 2018-06-22 SURGICAL SUPPLY — 14 items

## 2018-06-22 NOTE — Anesthesia Postprocedure Evaluation (Signed)
Anesthesia Post Note  Patient: Maurice Alvarado  Procedure(s) Performed: ESOPHAGOGASTRODUODENOSCOPY (EGD) WITH PROPOFOL (N/A )     Patient location during evaluation: Endoscopy Anesthesia Type: MAC Level of consciousness: awake and alert Pain management: pain level controlled Vital Signs Assessment: post-procedure vital signs reviewed and stable Respiratory status: spontaneous breathing, nonlabored ventilation, respiratory function stable and patient connected to nasal cannula oxygen Cardiovascular status: stable and blood pressure returned to baseline Postop Assessment: no apparent nausea or vomiting Anesthetic complications: no    Last Vitals:  Vitals:   06/22/18 0845 06/22/18 0850  BP: (!) 97/51 (!) 96/55  Pulse: 96 94  Resp: 20 20  Temp:    SpO2: 100% 100%    Last Pain:  Vitals:   06/22/18 0839  TempSrc: Oral  PainSc:                  Mayer Vondrak COKER

## 2018-06-22 NOTE — Transfer of Care (Signed)
Immediate Anesthesia Transfer of Care Note  Patient: Maurice Alvarado  Procedure(s) Performed: ESOPHAGOGASTRODUODENOSCOPY (EGD) WITH PROPOFOL (N/A )  Patient Location: Endoscopy Unit  Anesthesia Type:MAC  Level of Consciousness: awake  Airway & Oxygen Therapy: Patient Spontanous Breathing and Patient connected to nasal cannula oxygen  Post-op Assessment: Report given to RN and Post -op Vital signs reviewed and stable  Post vital signs: Reviewed and stable  Last Vitals:  Vitals Value Taken Time  BP 92/47 06/22/2018  8:38 AM  Temp    Pulse 104 06/22/2018  8:38 AM  Resp 19 06/22/2018  8:38 AM  SpO2 91 % 06/22/2018  8:38 AM  Vitals shown include unvalidated device data.  Last Pain:  Vitals:   06/22/18 0741  TempSrc: Oral  PainSc: 8          Complications: No apparent anesthesia complications

## 2018-06-22 NOTE — Op Note (Addendum)
[facility] Name: Maurice Alvarado Procedure Date : 06/22/2018 MRN: [id number] Attending MD: Tressia DanasKimberly Keysha Damewood MD, MD Date of Birth: 04/30/1961 CSN: [id number] Age: [age] Admit Type: Inpatient Procedure:                Upper GI endoscopy Indications:              Melena, Recent gastrointestinal bleeding.                            Progressive anemia. Gastric ulcer requiring                            antrectomy and retrocolic roux-en-y reconstruction                            05/13/18. Postoperative course complicated by leak                            and intra-abdominal fluid collection treated with                            perc drain. Providers:                Tressia DanasKimberly Megyn Leng MD, MD, Bonney LeitzShanice Khonje, Verita SchneidersPrasun                            Sharma, Technician, Dairl PonderFudan Jiang, CRNA Referring MD:              Medicines:                See the Anesthesia note for documentation of the                            administered medications Complications:            No immediate complications. Estimated Blood Loss:     Estimated blood loss: none. Procedure:                Pre-Anesthesia Assessment:                           - Prior to the procedure, a History and Physical                            was performed, and patient medications and                            allergies were reviewed. The patient's tolerance of                            previous anesthesia was also reviewed. The risks                            and benefits of the procedure and the sedation                            options and risks were discussed with the patient.  All questions were answered, and informed consent                            was obtained. Prior Anticoagulants: The patient has                            taken no previous anticoagulant or antiplatelet                            agents. ASA Grade Assessment: III - A patient with                            severe  systemic disease. After reviewing the risks                            and benefits, the patient was deemed in                            satisfactory condition to undergo the procedure.                           After obtaining informed consent, the endoscope was                            passed under direct vision. Throughout the                            procedure, the patient's blood pressure, pulse, and                            oxygen saturations were monitored continuously. The                            GIF-H190 ([id number] Olympus Adult EGD was introduced                            through the mouth, and advanced to the jejunum. The                            upper GI endoscopy was accomplished without                            difficulty. The patient tolerated the procedure                            well. Scope In: Scope Out: Findings:      LA Grade C (one or more mucosal breaks continuous between tops of 2 or       more mucosal folds, less than 75% circumference) esophagitis with no       bleeding was found. A small hiatal hernia is present.      Evidence of a Roux-en-Y gastrojejunostomy was found. Old clots were       present in the stomach. The visualized gastric mucosa appeared normal.  The gastrojejunal anastomosis was characterized by a linear ulceration       that appears to involve the entire anastomosis. No active bleeding noted       but there were fiercely adherent clots over large areas of the       anastomosis that did not move with pressage lavage. Visible sutures.       This was traversed. No blood present in the bowel distal to the       anastomosis. The duodenum-to-jejunum limb was not examined. Impression:               - LA Grade C reflux esophagitis. Not actively                            bleeding.                           - Small hiatal hernia.                           - Roux-en-Y gastrojejunostomy with gastrojejunal                             anastomosis characterized by visible sutures and                            ulceration. Adherent clot overlying the ulceration                            places him at high risk for rebleeding.                           - No specimens collected. Recommendation:           - NPO.                           - Give Protonix (pantoprazole): initiate therapy                            with 80 mg IV bolus, then 8 mg/hr IV by continuous                            infusion.                           - Add Carafate 1g slurries QID                           - Avoid all NSAIDs                           - Serial hgb/hct with transfusion as necessary                           - Surgical consultation: anastomotic ulceration                           - Keep the head  of the bed elevated. Procedure Code(s):        --- Professional ---                           [phone], Esophagogastroduodenoscopy, flexible,                            transoral; diagnostic, including collection of                            specimen(s) by brushing or washing, when performed                            (separate procedure) Diagnosis Code(s):        --- Professional ---                           K21.0, Gastro-esophageal reflux disease with                            esophagitis                           Z98.0, Intestinal bypass and anastomosis status                           K92.1, Melena (includes Hematochezia)                           K92.2, Gastrointestinal hemorrhage, unspecified CPT copyright 2018 American Medical Association. All rights reserved. The codes documented in this report are preliminary and upon coder review may  be revised to meet current compliance requirements. Tressia Danas MD, MD 06/22/2018 8:55:42 AM This report has been signed electronically. Number of Addenda: 0

## 2018-06-22 NOTE — Anesthesia Preprocedure Evaluation (Signed)
Anesthesia Evaluation  Patient identified by MRN, date of birth, ID band Patient awake    Reviewed: Allergy & Precautions, NPO status , Patient's Chart, lab work & pertinent test results  Airway Mallampati: II  TM Distance: >3 FB     Dental  (+) Edentulous Upper, Edentulous Lower   Pulmonary    breath sounds clear to auscultation       Cardiovascular  Rhythm:Regular Rate:Normal     Neuro/Psych    GI/Hepatic   Endo/Other    Renal/GU      Musculoskeletal   Abdominal   Peds  Hematology   Anesthesia Other Findings   Reproductive/Obstetrics                             Anesthesia Physical Anesthesia Plan  ASA: III  Anesthesia Plan: MAC   Post-op Pain Management:    Induction:   PONV Risk Score and Plan: Ondansetron  Airway Management Planned: Natural Airway and Nasal Cannula  Additional Equipment:   Intra-op Plan:   Post-operative Plan:   Informed Consent: I have reviewed the patients History and Physical, chart, labs and discussed the procedure including the risks, benefits and alternatives for the proposed anesthesia with the patient or authorized representative who has indicated his/her understanding and acceptance.     Plan Discussed with: CRNA and Anesthesiologist  Anesthesia Plan Comments:         Anesthesia Quick Evaluation

## 2018-06-22 NOTE — Progress Notes (Signed)
Call placed to lab at 0711 regarding AM labs drawn, per lab CBC would not be resulted by 0730 start time for EGD. Venipuncture performed for ISTAT. HCT result 30 hgb 10.2.

## 2018-06-22 NOTE — H&P (Signed)
Referring Provider: Dr Laren Everts  Primary Care Physician:  No primary care provider on file. Primary Gastroenterologist: Unassigned patient at [facility]   Reason for Consultation: GI bleed.   HPI: Maurice Alvarado is a 57 y.o. male.  Resident of [place].  PMH chronic back pain on chronic narcotics, lidocaine patch, ibuprofen.  Smokes 1 pack/day.  Patient suffered perforated gastric ulcer requiring antrectomy and retrocolic Roux-en-Y reconstruction 05/13/2018 in [place], [place].  Developed postoperative leak and was transferred to [facility] 11/21.  Intra-abdominal fluid collection treated with perc drain. He improved and was discharged 05/27/18 on Augmentin.    On 06/11/2018 he was transferred from [facility] to [facility] after developing altered mental status, hypotension.  WBCs 53.5, AKI.   T bili 1.1.  Alkaline phosphatase 150, AST/ALT 35/33.  Electrolytes deranged  Noncontrast CT showed known intra-abdominal fluid collections with drain in place.  Decreased, trace pneumoperitoneum anterior to the liver.  Left drainage catheter in place with distal and just anterior to the left hepatic lobe.  The left anterior abdominal fluid collection persists though considerably decreased in size.  Hyperdense contrast within the collection extending to supraumbilical ventral midline incision plane.  Diffuse mesenteric edema.  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  Upper normal to slightly dilated, distal end of the afferent limb within the right hemiabdomen opacified with retrograde contrast.  Proximal aspect of the afferent limb unopacified.  Small bowel otherwise normal caliber with mild, diffuse wall thickening.  Diffuse thickening of the stomach as well as the majority of the colon.  Enteric contrast opacifies the majority of small bowel loops as well as the colon through the rectum small amount of free pelvic  fluid.  Diffuse body wall edema.  Gallbladder wall thickened likely secondary to general edematous state there was a 3 x 1.2 cm fluid collection in the right hepatic lobe which was decreased compared with size of 4.5 x 2.5 on 05/23/2018.  Required Levophed for pressure support. Abscess drain was upsized.  He was treated with broad-spectrum antibiotics.    There was concern for upper GI bleed given blood-tinged NG tube output and Hgb of 6.4.  He received blood transfusion. Hgb 7.4 on 06/11/18.  Nutritional support provided with TPN which continues to this day. Perc drain contents looks dark, but more bilious than bloody.  He continues on TPN, he is n.p.o.  Ongoing BID IV Protonix, broad-spectrum antibiotics. While he has had ongoing anemia during the admission, Hgb 7.8 at arrival.  6.6 on 12/16, back up to 8.2 on 12/18.  Drifted to 5 on 12/19, 4.8 this morning, 7.5 after 2 U PRBCs   Yesterday he started having tarry stools, passed several of these.  Became hypotensive with reading as low as 70/34.  Blood pressures responded to aggressive IV fluids, received at least 2 L of normal saline yesterday.  Today the tarry stools turned to more of a red bloody BM.  This is not associated with abdominal pain or nausea.   Persistent fever to 101.1 despite Tylenol PR. 06/21/2018 CT abdomen pelvis with contrast shows bil pleural effusions and atelectasis.  Postop changes consistent with gastric bypass without evidence of inflammation or obstruction.  Small amount of free fluid and free air in the abdomen, likely postoperative.  Pigtail drainage catheter present in left upper quadrant fluid collection.  Small amount of gas within the collection.  Urinary bladder thickened and contains 3 mm stone.  BUN is not elevated.  Lactic acid not elevated x2 yesterday  Allergies:   None known.  Scheduled Meds: TNA IV thiamine daily Humalog sliding scale insulin Fentanyl patch every 72 hours Zosyn Vancomycin  IV Protonix 40 mg IV twice daily   Social History  Patient is divorced.  Does not drink alcohol.  Smokes about a pack per day.  Employed as an Systems developer. Did contact information is a friend Felipa Emory his mobile number [phone]   REVIEW OF SYSTEMS: Constitutional: Patient denies feeling weak or dizzy. ENT:  No nose bleeds Pulm: Denies shortness of breath and cough. CV:  No palpitations, no LE edema.  No chest pain GU: No hematuria or frequency. GI: Denies nausea, vomiting, abdominal pain. Heme: Unaware of any issues with unusual or excessive bleeding. Transfusions: per HPI Neuro:  No headaches, no peripheral tingling or numbness Derm:  No itching, no rash or sores.  Endocrine:  No sweats or chills.  No polyuria or dysuria Immunization:  Not known Travel:  None beyond local counties in last few months.    PHYSICAL EXAM: Vital signs in last 24 hours: There were no vitals filed for this visit. Wt Readings from Last 3 Encounters:  No data found for Wt  Temp 100.9.  Blood pressure 103/53.  Heart rate 90.  Pulse ox 96%.  Weight 116.7 # (baseline prior to perforated ulcer 145#)  General: Cachectic, pale, chronically ill looking.  Sitting in bed watching TV, comfortable. Head: No facial asymmetry or swelling.  No signs of head trauma. Eyes: Slight conjunctival pallor.  No scleral icterus.  EOMI. Ears: Not hard of hearing Nose: No discharge or congestion Mouth: Edentulous.  Full set of dentures in place, not removed for exam.  Tongue midline.  Oral mucosa moist, pink, clear. Neck: No JVD, no masses, no thyromegaly. Lungs: No labored breathing, no cough.  No adventitious sounds. Heart: RRR.  No MRG.  S1, S2 present Abdomen: Soft.  Not tender, not distended.  Active bowel sounds.  Wound VAC with dark, bilious, not bloody, nonpurulent liquid.  No leaking from the wound..   Rectal: Did not perform. Musc/Skeltl: No joint redness, swelling. Extremities: No CCE.   Limbs generally thin with muscular wasting. Neurologic: Alert.  Follows commands.  Full limb strength.  No tremors.  Oriented to self.  [facility] as to where he was.  Gave year as 2020. Skin: Pale Tattoos: Multiple amateur/somewhat crude tattoos on the arms Nodes: No cervical adenopathy Psych: Calm, pleasant, cooperative.  Intake/Output from previous day: No intake/output data recorded. Intake/Output this shift: No intake/output data recorded.  LAB RESULTS: RecentLabs(last2labs)       Recent Labs    06/20/18 2209 06/21/18 0052 06/21/18 1031  WBC 4.2 4.0 4.2  HGB 5.0* 4.8* 7.5*  HCT 16.1* 14.7* 22.8*  PLT 302 312 274     BMET RecentLabs       Lab Results  Component Value Date   NA 139 06/21/2018   NA 135 06/20/2018   NA 136 06/19/2018   K 3.0 (L) 06/21/2018   K 3.8 06/21/2018   K 2.9 (L) 06/20/2018   CL 115 (H) 06/21/2018   CL 108 06/20/2018   CL 109 06/19/2018   CO2 17 (L) 06/21/2018   CO2 20 (L) 06/20/2018   CO2 19 (L) 06/19/2018   GLUCOSE 128 (H) 06/21/2018   GLUCOSE 121 (H) 06/20/2018   GLUCOSE 111 (H) 06/19/2018   BUN 14 06/21/2018   BUN 12 06/20/2018   BUN 14 06/19/2018  CREATININE 0.82 06/21/2018   CREATININE 0.83 06/20/2018   CREATININE 0.86 06/19/2018   CALCIUM 6.5 (L) 06/21/2018   CALCIUM 6.7 (L) 06/20/2018   CALCIUM 6.9 (L) 06/19/2018     LFT RecentLabs(last2labs)      Recent Labs    06/19/18 0824 06/21/18 1031  PROT 5.7* 4.8*  ALBUMIN 1.5* 1.5*  AST 17 226*  ALT 28 239*  ALKPHOS 133* 95  BILITOT 0.3 0.7     PT/INR RecentLabs  No results found for: INR, PROTIME   Hepatitis Panel RecentLabs(last2labs)  No results for input(s): HEPBSAG, HCVAB, HEPAIGM, HEPBIGM in the last 72 hours.   C-Diff RecentLabs  No components found for: CDIFF   Lipase  Labs(Brief)  No results found for: LIPASE    Drugs of Abuse  Labs(Brief)  No results found for: LABOPIA,  COCAINSCRNUR, LABBENZ, AMPHETMU, THCU, LABBARB     RADIOLOGY STUDIES:  ImagingResults(Last48hours)  Ct Abdomen Pelvis W Contrast  Result Date: 06/21/2018 CLINICAL DATA:  Generalized abdominal pain. Recent abdominal surgery. Nausea and rectal bleeding. Fever of unknown origin. EXAM: CT ABDOMEN AND PELVIS WITH CONTRAST TECHNIQUE: Multidetector CT imaging of the abdomen and pelvis was performed using the standard protocol following bolus administration of intravenous contrast. CONTRAST:  113m OMNIPAQUE IOHEXOL 300 MG/ML  SOLN COMPARISON:  None. FINDINGS: Lower chest: Moderate bilateral pleural effusions with basilar atelectasis. Hepatobiliary: No focal liver abnormality is seen. No gallstones, gallbladder wall thickening, or biliary dilatation. Pancreas: Unremarkable. No pancreatic ductal dilatation or surrounding inflammatory changes. Spleen: Normal in size without focal abnormality. Adrenals/Urinary Tract: No adrenal gland nodules. Punctate sized intrarenal stones bilaterally. Nephrograms are symmetrical. Mild bilateral hydronephrosis. Mild bladder wall thickening may indicate cystitis. Stone in the dependent left bladder measuring 3 mm diameter. This may represent a recently passed stone. Nephrograms are symmetrical. Stomach/Bowel: Stomach and small bowel are mostly decompressed. No abnormal colonic distention or wall thickening. Contrast material in the rectosigmoid colon indicating no evidence of obstruction. No discrete wall thickening or inflammatory infiltration identified. Postoperative changes consistent with gastric bypass. Vascular/Lymphatic: Aortic atherosclerosis. No enlarged abdominal or pelvic lymph nodes. Reproductive: Prostate is unremarkable. Other: Small amount of free fluid and free air in the abdomen, likely postoperative. There is left upper quadrant fluid collection anteriorly with fluid or thickening in the anterior abdominal wall muscles. A pigtail drainage catheter is present  within the collection. Small amount of gas also in the collection. Right lower quadrant anterior subcutaneous gas likely results from injection site. Musculoskeletal: No acute or significant osseous findings. IMPRESSION: 1. Moderate bilateral pleural effusions with basilar atelectasis. 2. Postoperative changes consistent with gastric bypass. No evidence of bowel obstruction or inflammation. 3. Small amount of free fluid and free air in the abdomen, likely postoperative. Left upper quadrant fluid collection anteriorly with fluid or thickening in the anterior abdominal wall muscles. A pigtail drainage catheter is present within the collection. Small amount of gas also in the collection. 4. Bladder wall thickening may indicate cystitis. 3 mm stone in the dependent left bladder may represent a recently passed stone. Aortic Atherosclerosis (ICD10-I70.0). Electronically Signed   By: WLucienne CapersM.D.   On: 06/21/2018 02:09   Dg Chest Port 1 View  Result Date: 06/19/2018 CLINICAL DATA:  Catheter placement. EXAM: PORTABLE CHEST 1 VIEW COMPARISON:  Chest radiograph June 19, 2018 at 0838 hours. FINDINGS: Redirected RIGHT tunneled catheter distal tip now projects at cavoatrial junction. No pneumothorax. Stable RIGHT lung base bandlike density. Small pleural effusions. Mild bronchitic changes. Cardiomediastinal silhouette is normal.  Soft tissue planes and included osseous structures are nonacute. IMPRESSION: 1. Redirected RIGHT tunneled catheter distal tip projects at cavoatrial junction. 2. Stable RIGHT lower lobe suspected pneumonia. Bronchitic changes and small pleural effusions. Followup PA and lateral chest X-ray is recommended in 3-4 weeks following trial of antibiotic therapy to ensure resolution and exclude underlying malignancy. Electronically Signed   By: Elon Alas M.D.   On: 06/19/2018 14:03      IMPRESSION:   *    GI bleed patient with complicated overrated gastric ulcer requiring  surgery and drain placement for postop wound leak/abscess.Maurice Alvarado developed melenic stools, the last few today have been more frank blood.  *     Hypotension, improved with boluses of IV fluids yesterday  *    Acute on chronic anemia.  Currently blood loss contributing but suspect baseline of anemia of chronic disease.  Transfuse blood during hospitalization at [facility].  Transfuse 2 units PRBCs this morning.    *    Perforated ulcer early November 2019.  11/11 antrectomy and retrocolic Roux-en-Y.  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  *   Hypokalemia.  Hypomagnesemia.  Protein calorie malnutrition, remains on TPN.  *    Elevated transaminases.  CT scan of abdomen and pelvis today shows catheter in the left upper quadrant fluid collection.  Suspect transaminases elevated to recent hypotension, hepatic hypoperfusion/shock as well as ongoing TPN, other than hypoalbuminemia and mildly elevated alk phos 133, LFTs were normal on 12/18.  *    Fevers, tachycardia without leukocytosis.   Lactic acid not elevated. CXR 3 d ago with RLL PNA.   Antibiotics: Vancomycin, Zosyn.   PLAN:     *    Case D/W Dr. Rush Landmark.  Plan EGD today, time TBD.  Staff at [facility] aware.  Continue IV Protonix for now.    Azucena Freed  06/21/2018, 12:37 PM Phone [phone]

## 2018-06-23 ENCOUNTER — Encounter (HOSPITAL_COMMUNITY): Payer: Self-pay | Admitting: Gastroenterology

## 2018-06-23 LAB — TYPE AND SCREEN
ABO/RH(D): O NEG
Antibody Screen: NEGATIVE
Unit division: 0
Unit division: 0
Unit division: 0
Unit division: 0
Unit division: 0
Unit division: 0

## 2018-06-23 LAB — BPAM RBC
BLOOD PRODUCT EXPIRATION DATE: 202001142359
BLOOD PRODUCT EXPIRATION DATE: 202001152359
Blood Product Expiration Date: 202001112359
Blood Product Expiration Date: 202001112359
Blood Product Expiration Date: 202001142359
Blood Product Expiration Date: 202001152359
ISSUE DATE / TIME: 201912200214
ISSUE DATE / TIME: 201912200600
ISSUE DATE / TIME: 201912202015
ISSUE DATE / TIME: 201912210038
ISSUE DATE / TIME: 201912211423
ISSUE DATE / TIME: 201912211708
UNIT TYPE AND RH: 5100
Unit Type and Rh: 5100
Unit Type and Rh: 5100
Unit Type and Rh: 5100
Unit Type and Rh: 5100
Unit Type and Rh: 5100

## 2018-06-23 LAB — PREPARE FRESH FROZEN PLASMA
UNIT DIVISION: 0
Unit division: 0

## 2018-06-23 LAB — BPAM FFP
Blood Product Expiration Date: 201912262359
Blood Product Expiration Date: 201912262359
Unit Type and Rh: 5100
Unit Type and Rh: 9500

## 2018-06-24 LAB — CULTURE, BLOOD (ROUTINE X 2)
CULTURE: NO GROWTH
Culture: NO GROWTH
Special Requests: ADEQUATE

## 2018-06-24 LAB — POCT I-STAT, CHEM 8
BUN: 14 mg/dL (ref 6–20)
CALCIUM ION: 1 mmol/L — AB (ref 1.15–1.40)
Chloride: 110 mmol/L (ref 98–111)
Creatinine, Ser: 0.7 mg/dL (ref 0.61–1.24)
Glucose, Bld: 114 mg/dL — ABNORMAL HIGH (ref 70–99)
HCT: 30 % — ABNORMAL LOW (ref 39.0–52.0)
Hemoglobin: 10.2 g/dL — ABNORMAL LOW (ref 13.0–17.0)
Potassium: 3.4 mmol/L — ABNORMAL LOW (ref 3.5–5.1)
SODIUM: 139 mmol/L (ref 135–145)
TCO2: 19 mmol/L — ABNORMAL LOW (ref 22–32)

## 2019-10-09 IMAGING — DX DG CHEST 1V PORT
1 series · 1 of 1 positions shown · non-contrast
Comparison: None.

CLINICAL DATA: Fever

EXAM:
PORTABLE CHEST 1 VIEW

[chest ap]
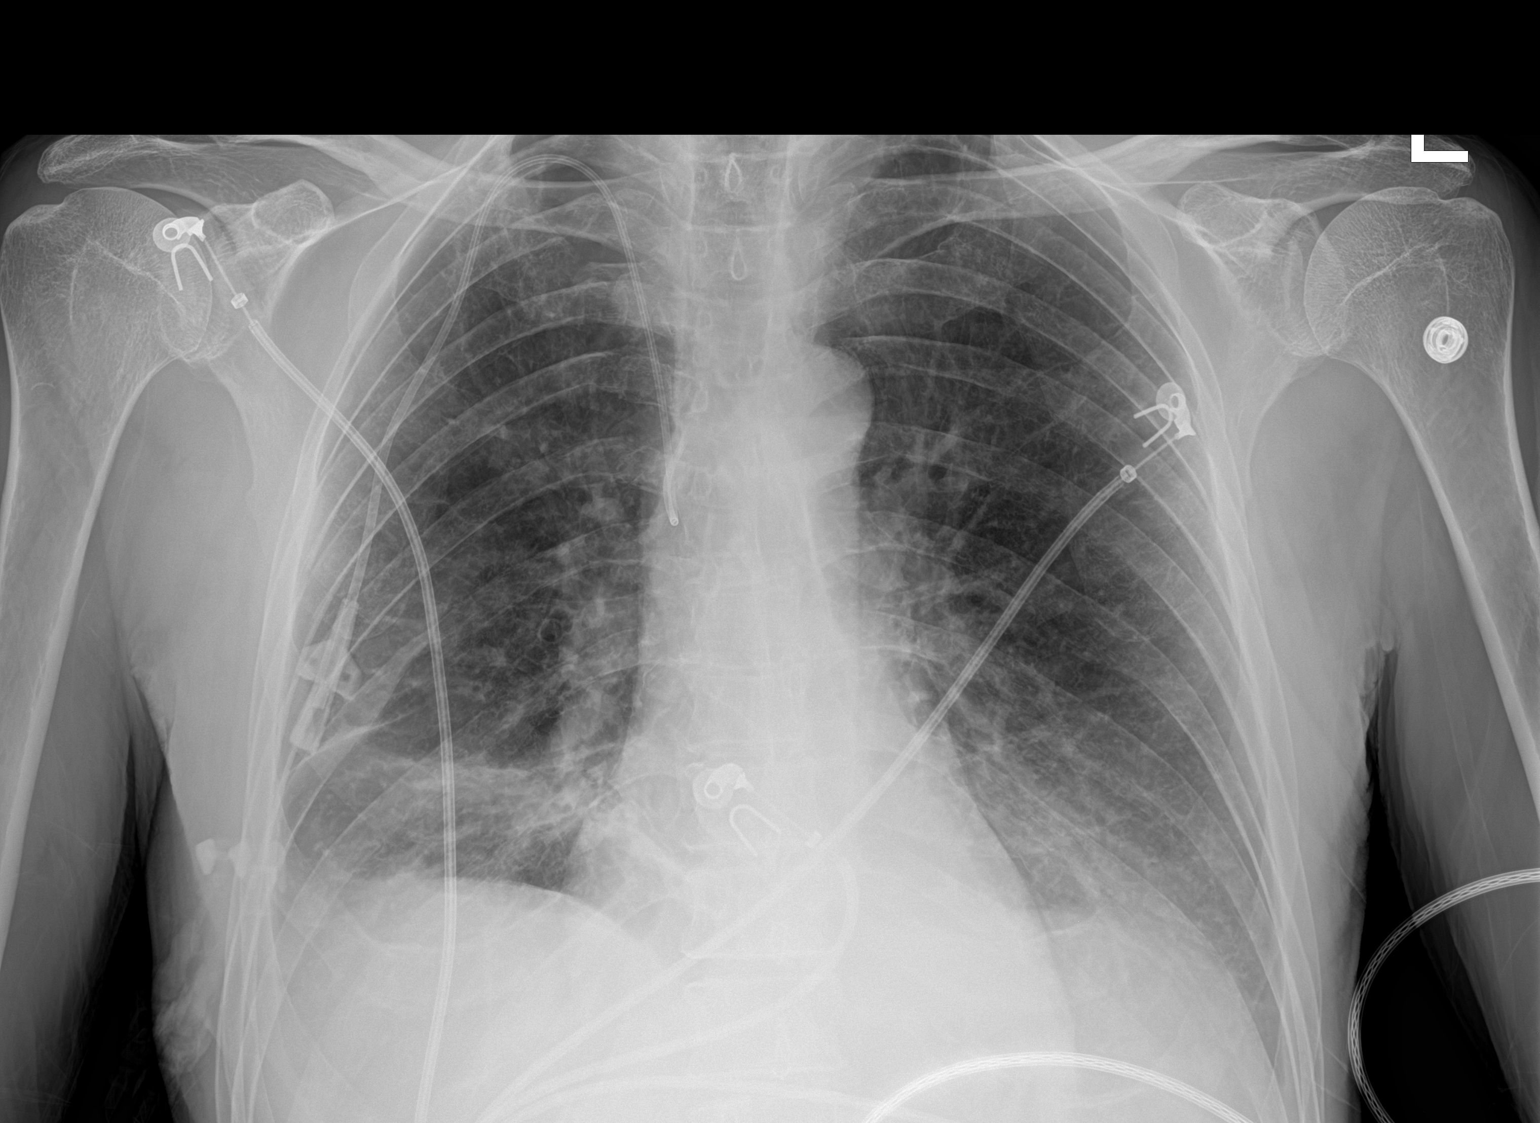

[1 of 1 positions shown; findings below may reference images not displayed]

FINDINGS: Central catheter tip is either within the azygos vein or loops upon
itself in the superior vena cava. No pneumothorax. There is patchy
airspace opacity in the bases with small right pleural effusion.
Lungs elsewhere are clear. Heart size and pulmonary vascular normal.
No adenopathy. No bone lesions.
IMPRESSION: 1. Central catheter tip either loops upon itself in the superior
vena cava or extends into the azygos vein. No pneumothorax.

2. Patchy infiltrate felt to represent pneumonia in the bases with
small right pleural effusion.

These results will be called to the ordering clinician or
representative by the Radiologist Assistant, and communication
documented in the PACS or zVision Dashboard.

## 2020-05-03 DEATH — deceased
